# Patient Record
Sex: Female | Born: 1960 | Race: White | Hispanic: No | Smoking: Never smoker
Health system: Southern US, Community
[De-identification: ages and names within clinical notes are randomized; demographics above are authoritative.]

## PROBLEM LIST (undated history)

## (undated) HISTORY — PX: BREAST BIOPSY: SHX20

---

## 1997-09-01 ENCOUNTER — Inpatient Hospital Stay (HOSPITAL_COMMUNITY): Admission: AD | Admit: 1997-09-01 | Discharge: 1997-09-01 | Payer: Self-pay | Admitting: Obstetrics and Gynecology

## 1997-09-02 ENCOUNTER — Inpatient Hospital Stay (HOSPITAL_COMMUNITY): Admission: AD | Admit: 1997-09-02 | Discharge: 1997-09-05 | Payer: Self-pay | Admitting: Obstetrics and Gynecology

## 1999-01-20 ENCOUNTER — Encounter: Payer: Self-pay | Admitting: Emergency Medicine

## 1999-01-20 ENCOUNTER — Emergency Department (HOSPITAL_COMMUNITY): Admission: EM | Admit: 1999-01-20 | Discharge: 1999-01-20 | Payer: Self-pay | Admitting: Emergency Medicine

## 1999-02-13 ENCOUNTER — Other Ambulatory Visit: Admission: RE | Admit: 1999-02-13 | Discharge: 1999-02-13 | Payer: Self-pay | Admitting: Obstetrics and Gynecology

## 2000-05-13 ENCOUNTER — Other Ambulatory Visit: Admission: RE | Admit: 2000-05-13 | Discharge: 2000-05-13 | Payer: Self-pay | Admitting: Obstetrics and Gynecology

## 2001-11-25 ENCOUNTER — Other Ambulatory Visit: Admission: RE | Admit: 2001-11-25 | Discharge: 2001-11-25 | Payer: Self-pay | Admitting: Obstetrics and Gynecology

## 2004-03-20 ENCOUNTER — Other Ambulatory Visit: Admission: RE | Admit: 2004-03-20 | Discharge: 2004-03-20 | Payer: Self-pay | Admitting: Obstetrics and Gynecology

## 2005-03-19 ENCOUNTER — Encounter (INDEPENDENT_AMBULATORY_CARE_PROVIDER_SITE_OTHER): Payer: Self-pay | Admitting: Specialist

## 2005-03-19 ENCOUNTER — Encounter: Admission: RE | Admit: 2005-03-19 | Discharge: 2005-03-19 | Payer: Self-pay | Admitting: Surgery

## 2005-03-24 ENCOUNTER — Other Ambulatory Visit: Admission: RE | Admit: 2005-03-24 | Discharge: 2005-03-24 | Payer: Self-pay | Admitting: Obstetrics and Gynecology

## 2005-09-19 ENCOUNTER — Encounter: Admission: RE | Admit: 2005-09-19 | Discharge: 2005-09-19 | Payer: Self-pay | Admitting: Obstetrics and Gynecology

## 2006-01-22 ENCOUNTER — Encounter: Admission: RE | Admit: 2006-01-22 | Discharge: 2006-01-22 | Payer: Self-pay | Admitting: Obstetrics and Gynecology

## 2007-02-09 ENCOUNTER — Encounter: Admission: RE | Admit: 2007-02-09 | Discharge: 2007-02-09 | Payer: Self-pay | Admitting: Obstetrics and Gynecology

## 2008-08-10 ENCOUNTER — Encounter: Admission: RE | Admit: 2008-08-10 | Discharge: 2008-08-10 | Payer: Self-pay | Admitting: Obstetrics and Gynecology

## 2010-06-04 ENCOUNTER — Other Ambulatory Visit: Payer: Self-pay | Admitting: Obstetrics and Gynecology

## 2010-06-04 DIAGNOSIS — Z1231 Encounter for screening mammogram for malignant neoplasm of breast: Secondary | ICD-10-CM

## 2010-06-07 ENCOUNTER — Ambulatory Visit
Admission: RE | Admit: 2010-06-07 | Discharge: 2010-06-07 | Disposition: A | Discharge status: Home / Self Care | Payer: BC Managed Care – PPO | Source: Ambulatory Visit | Attending: Obstetrics and Gynecology | Admitting: Obstetrics and Gynecology

## 2010-06-07 DIAGNOSIS — Z1231 Encounter for screening mammogram for malignant neoplasm of breast: Secondary | ICD-10-CM

## 2011-05-01 ENCOUNTER — Other Ambulatory Visit: Payer: Self-pay | Admitting: Obstetrics and Gynecology

## 2011-05-01 DIAGNOSIS — Z1231 Encounter for screening mammogram for malignant neoplasm of breast: Secondary | ICD-10-CM

## 2011-06-09 ENCOUNTER — Ambulatory Visit: Payer: BC Managed Care – PPO

## 2012-02-19 ENCOUNTER — Other Ambulatory Visit: Payer: Self-pay | Admitting: Obstetrics and Gynecology

## 2012-02-19 DIAGNOSIS — Z1231 Encounter for screening mammogram for malignant neoplasm of breast: Secondary | ICD-10-CM

## 2012-03-26 ENCOUNTER — Ambulatory Visit
Admission: RE | Admit: 2012-03-26 | Discharge: 2012-03-26 | Disposition: A | Discharge status: Home / Self Care | Payer: BC Managed Care – PPO | Source: Ambulatory Visit | Attending: Obstetrics and Gynecology | Admitting: Obstetrics and Gynecology

## 2012-03-26 DIAGNOSIS — Z1231 Encounter for screening mammogram for malignant neoplasm of breast: Secondary | ICD-10-CM

## 2013-08-05 ENCOUNTER — Emergency Department (HOSPITAL_COMMUNITY): Payer: BC Managed Care – PPO

## 2013-08-05 ENCOUNTER — Encounter (HOSPITAL_COMMUNITY): Payer: Self-pay | Admitting: Emergency Medicine

## 2013-08-05 ENCOUNTER — Emergency Department (HOSPITAL_COMMUNITY)
Admission: EM | Admit: 2013-08-05 | Discharge: 2013-08-05 | Disposition: A | Discharge status: Home / Self Care | Payer: BC Managed Care – PPO | Attending: Emergency Medicine | Admitting: Emergency Medicine

## 2013-08-05 DIAGNOSIS — M5417 Radiculopathy, lumbosacral region: Secondary | ICD-10-CM

## 2013-08-05 DIAGNOSIS — IMO0002 Reserved for concepts with insufficient information to code with codable children: Secondary | ICD-10-CM | POA: Insufficient documentation

## 2013-08-05 DIAGNOSIS — R202 Paresthesia of skin: Secondary | ICD-10-CM

## 2013-08-05 DIAGNOSIS — S8012XA Contusion of left lower leg, initial encounter: Secondary | ICD-10-CM

## 2013-08-05 DIAGNOSIS — S7010XA Contusion of unspecified thigh, initial encounter: Secondary | ICD-10-CM | POA: Insufficient documentation

## 2013-08-05 MED ORDER — PREDNISONE 10 MG PO TABS: ORAL_TABLET | ORAL | Status: AC

## 2013-08-05 NOTE — ED Notes (Signed)
Pt returned from MRI °
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
## 2013-08-05 NOTE — Discharge Instructions (Signed)
Use heat on the sore area 3 times a day. Take Tylenol, for pain. Elevate your feet above yourr heart, as much as possible for several more days. Followup with the doctor , of your choice,  if not better in one or 2 weeks.    Contusion A contusion is a deep bruise. Contusions happen when an injury causes bleeding under the skin. Signs of bruising include pain, puffiness (swelling), and discolored skin. The contusion may turn blue, purple, or yellow. HOME CARE   Put ice on the injured area.  Put ice in a plastic bag.  Place a towel between your skin and the bag.  Leave the ice on for 15-20 minutes, 03-04 times a day.  Only take medicine as told by your doctor.  Rest the injured area.  If possible, raise (elevate) the injured area to lessen puffiness. GET HELP RIGHT AWAY IF:   You have more bruising or puffiness.  You have pain that is getting worse.  Your puffiness or pain is not helped by medicine. MAKE SURE YOU:   Understand these instructions.  Will watch your condition.  Will get help right away if you are not doing well or get worse. Document Released: 08/20/2007 Document Revised: 05/26/2011 Document Reviewed: 01/06/2011 [facility] Patient Information 2014 [place], [place].

## 2013-08-05 NOTE — ED Notes (Addendum)
Pt ambulating to restroom with steady gait
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
## 2013-08-05 NOTE — ED Notes (Signed)
Pt in MRI.

## 2013-08-18 ENCOUNTER — Other Ambulatory Visit: Payer: Self-pay

## 2013-08-18 DIAGNOSIS — Z1231 Encounter for screening mammogram for malignant neoplasm of breast: Secondary | ICD-10-CM

## 2013-08-22 ENCOUNTER — Ambulatory Visit
Admission: RE | Admit: 2013-08-22 | Discharge: 2013-08-22 | Disposition: A | Discharge status: Home / Self Care | Payer: BC Managed Care – PPO | Source: Ambulatory Visit

## 2013-08-22 DIAGNOSIS — Z1231 Encounter for screening mammogram for malignant neoplasm of breast: Secondary | ICD-10-CM

## 2014-03-07 ENCOUNTER — Other Ambulatory Visit: Payer: Self-pay | Admitting: Dermatology

## 2014-09-12 ENCOUNTER — Other Ambulatory Visit: Payer: Self-pay

## 2014-09-12 DIAGNOSIS — Z1231 Encounter for screening mammogram for malignant neoplasm of breast: Secondary | ICD-10-CM

## 2014-10-16 ENCOUNTER — Ambulatory Visit
Admission: RE | Admit: 2014-10-16 | Discharge: 2014-10-16 | Disposition: A | Discharge status: Home / Self Care | Payer: BLUE CROSS/BLUE SHIELD | Source: Ambulatory Visit

## 2014-10-16 DIAGNOSIS — Z1231 Encounter for screening mammogram for malignant neoplasm of breast: Secondary | ICD-10-CM

## 2015-10-16 ENCOUNTER — Other Ambulatory Visit: Payer: Self-pay | Admitting: Obstetrics and Gynecology

## 2015-10-16 DIAGNOSIS — Z1231 Encounter for screening mammogram for malignant neoplasm of breast: Secondary | ICD-10-CM

## 2015-10-18 ENCOUNTER — Ambulatory Visit
Admission: RE | Admit: 2015-10-18 | Discharge: 2015-10-18 | Disposition: A | Discharge status: Home / Self Care | Payer: BLUE CROSS/BLUE SHIELD | Source: Ambulatory Visit | Attending: Obstetrics and Gynecology | Admitting: Obstetrics and Gynecology

## 2015-10-18 DIAGNOSIS — Z1231 Encounter for screening mammogram for malignant neoplasm of breast: Secondary | ICD-10-CM

## 2016-08-14 ENCOUNTER — Other Ambulatory Visit: Payer: Self-pay | Admitting: Obstetrics and Gynecology

## 2016-08-14 DIAGNOSIS — Z1231 Encounter for screening mammogram for malignant neoplasm of breast: Secondary | ICD-10-CM

## 2016-10-21 ENCOUNTER — Ambulatory Visit
Admission: RE | Admit: 2016-10-21 | Discharge: 2016-10-21 | Disposition: A | Discharge status: Home / Self Care | Payer: BLUE CROSS/BLUE SHIELD | Source: Ambulatory Visit | Attending: Obstetrics and Gynecology | Admitting: Obstetrics and Gynecology

## 2016-10-21 DIAGNOSIS — Z1231 Encounter for screening mammogram for malignant neoplasm of breast: Secondary | ICD-10-CM

## 2017-08-03 ENCOUNTER — Other Ambulatory Visit: Payer: Self-pay

## 2017-08-03 ENCOUNTER — Emergency Department (HOSPITAL_COMMUNITY)
Admission: EM | Admit: 2017-08-03 | Discharge: 2017-08-03 | Disposition: A | Discharge status: Home / Self Care | Payer: BLUE CROSS/BLUE SHIELD | Attending: Emergency Medicine | Admitting: Emergency Medicine

## 2017-08-03 ENCOUNTER — Emergency Department (HOSPITAL_COMMUNITY): Payer: BLUE CROSS/BLUE SHIELD

## 2017-08-03 ENCOUNTER — Encounter (HOSPITAL_COMMUNITY): Payer: Self-pay

## 2017-08-03 DIAGNOSIS — Y998 Other external cause status: Secondary | ICD-10-CM | POA: Diagnosis not present

## 2017-08-03 DIAGNOSIS — S20211A Contusion of right front wall of thorax, initial encounter: Secondary | ICD-10-CM | POA: Insufficient documentation

## 2017-08-03 DIAGNOSIS — Y9389 Activity, other specified: Secondary | ICD-10-CM | POA: Insufficient documentation

## 2017-08-03 DIAGNOSIS — W01190A Fall on same level from slipping, tripping and stumbling with subsequent striking against furniture, initial encounter: Secondary | ICD-10-CM | POA: Insufficient documentation

## 2017-08-03 DIAGNOSIS — R0781 Pleurodynia: Secondary | ICD-10-CM

## 2017-08-03 DIAGNOSIS — Y929 Unspecified place or not applicable: Secondary | ICD-10-CM | POA: Diagnosis not present

## 2017-08-03 DIAGNOSIS — S299XXA Unspecified injury of thorax, initial encounter: Secondary | ICD-10-CM | POA: Diagnosis present

## 2017-08-03 MED ORDER — HYDROCODONE-ACETAMINOPHEN 5-325 MG PO TABS: 1.0000 | ORAL_TABLET | Freq: Four times a day (QID) | ORAL | 0 refills | Status: AC | PRN

## 2017-08-03 MED ORDER — HYDROCODONE-ACETAMINOPHEN 5-325 MG PO TABS
1.0000 | ORAL_TABLET | Freq: Once | ORAL | Status: AC
  Administered 2017-08-03: 1 via ORAL
  Filled 2017-08-03: qty 1

## 2017-08-03 NOTE — ED Provider Notes (Signed)
[facility]-EMERGENCY DEPT Provider Note   CSN: [id number] Arrival date & time: 08/03/17  0865     History   Chief Complaint Chief Complaint  Patient presents with  . Fall  . Rib Injury    HPI Pamela David is a 57 y.o. female.  HPI Patient tripped and fell yesterday striking her righ lower chest on a table.  Complaining of pain.  No fevers.  Pain is worse with movement and deep breaths.  States it was severe this morning and she went into "shock". History reviewed. No pertinent past medical history.  There are no active problems to display for this patient.   Past Surgical History:  Procedure Laterality Date  . BREAST BIOPSY       OB History   None      Home Medications    Prior to Admission medications   Medication Sig Start Date End Date Taking? Authorizing Provider  naproxen sodium (ALEVE) 220 MG tablet Take 440 mg by mouth as needed.   Yes [provider]  HYDROcodone-acetaminophen (NORCO/VICODIN) 5-325 MG tablet Take 1-2 tablets by mouth every 6 (six) hours as needed. 08/03/17   Benjiman Core, MD  predniSONE (DELTASONE) 10 MG tablet Take q day 6,5,4,3,2,1 08/05/13   Mancel Bale, MD    Family History History reviewed. No pertinent family history.  Social History Social History   Tobacco Use  . Smoking status: Never Smoker  . Smokeless tobacco: Never Used  Substance Use Topics  . Alcohol use: Yes  . Drug use: Never     Allergies   Sulfa antibiotics   Review of Systems Review of Systems  Constitutional: Negative for appetite change.  HENT: Negative for congestion.   Respiratory: Positive for shortness of breath.   Cardiovascular: Positive for chest pain.  Gastrointestinal: Negative for abdominal pain.  Genitourinary: Negative for flank pain.  Musculoskeletal: Negative for back pain.  Skin: Negative for rash.  Neurological: Negative for weakness.  Psychiatric/Behavioral: Negative for confusion.      Physical Exam Updated Vital Signs BP (!) 111/55 (BP Location: Right Arm)   Pulse 77   Temp 99.4 F (37.4 C) (Oral)   Resp 18   Ht  (1.549 m)   Wt 63.5 kg (140 lb)   SpO2 100%   BMI 26.45 kg/m   Physical Exam  Constitutional: She appears well-developed.  Patient appears uncomfortable  HENT:  Head: Normocephalic.  Eyes: Pupils are equal, round, and reactive to light.  Cardiovascular: Normal rate.  Pulmonary/Chest: She exhibits tenderness.  Equal breath sounds.  Tender right lower lateral and posterior chest wall without crepitance or deformity.  Abdominal: There is no tenderness.  No left or right upper quadrant tenderness.  Musculoskeletal:  No extremity tenderness.  Neurological: She is alert.  Skin: Skin is warm. Capillary refill takes less than 2 seconds.     ED Treatments / Results  Labs (all labs ordered are listed, but only abnormal results are displayed) Labs Reviewed - No data to display  EKG None  Radiology Dg Ribs Unilateral W/chest Right  Result Date: 08/03/2017 CLINICAL DATA:  Patient suffered a trip and fall last night chasing a cat night with a blow to the right side of the chest and onset of pain. Initial encounter. EXAM: RIGHT RIBS AND CHEST - 3+ VIEW COMPARISON:  None. FINDINGS: Punctate calcified granuloma is seen in the right upper lobe. The lungs are otherwise clear. No pneumothorax or pleural effusion. Heart size is normal. No fracture  or other focal bony abnormality IMPRESSION: Negative exam. Electronically Signed   By: Drusilla Kanner M.D.   On: 08/03/2017 11:21    Procedures Procedures (including critical care time)  Medications Ordered in ED Medications  HYDROcodone-acetaminophen (NORCO/VICODIN) 5-325 MG per tablet 1 tablet (1 tablet Oral Given 08/03/17 1302)     Initial Impression / Assessment and Plan / ED Course  I have reviewed the triage vital signs and the nursing notes.  Pertinent labs & imaging results that were  available during my care of the patient were reviewed by me and considered in my medical decision making (see chart for details).     Patient with fall.  Right-sided posterior and lateral chest pain.  No crepitance or subcu emphysema.  X-ray does not show fracture or pneumothorax.  Doubt intra-abdominal injury.  Pain medicine given.  Will treat as if there are clinical rib fractures.  Discharge home.  Final Clinical Impressions(s) / ED Diagnoses   Final diagnoses:  Contusion of rib on right side, initial encounter    ED Discharge Orders        Ordered    HYDROcodone-acetaminophen (NORCO/VICODIN) 5-325 MG tablet  Every 6 hours PRN     08/03/17 1308       Benjiman Core, MD 08/03/17 1311

## 2017-08-03 NOTE — ED Triage Notes (Signed)
Patient states she was chasing a cat and fell over a table last night. Patient c/o right rib pain. Patient denies any breathing problems.

## 2017-11-05 ENCOUNTER — Other Ambulatory Visit: Payer: Self-pay | Admitting: Obstetrics and Gynecology

## 2017-11-05 DIAGNOSIS — Z1231 Encounter for screening mammogram for malignant neoplasm of breast: Secondary | ICD-10-CM

## 2017-12-04 ENCOUNTER — Ambulatory Visit: Payer: BLUE CROSS/BLUE SHIELD

## 2018-01-20 ENCOUNTER — Ambulatory Visit
Admission: RE | Admit: 2018-01-20 | Discharge: 2018-01-20 | Disposition: A | Discharge status: Home / Self Care | Payer: BLUE CROSS/BLUE SHIELD | Source: Ambulatory Visit | Attending: Obstetrics and Gynecology | Admitting: Obstetrics and Gynecology

## 2018-01-20 DIAGNOSIS — Z1231 Encounter for screening mammogram for malignant neoplasm of breast: Secondary | ICD-10-CM

## 2019-02-17 ENCOUNTER — Other Ambulatory Visit: Payer: Self-pay

## 2019-02-17 ENCOUNTER — Other Ambulatory Visit: Payer: Self-pay | Admitting: Obstetrics and Gynecology

## 2019-02-17 ENCOUNTER — Ambulatory Visit
Admission: RE | Admit: 2019-02-17 | Discharge: 2019-02-17 | Disposition: A | Discharge status: Home / Self Care | Payer: BLUE CROSS/BLUE SHIELD | Source: Ambulatory Visit | Attending: Obstetrics and Gynecology | Admitting: Obstetrics and Gynecology

## 2019-02-17 DIAGNOSIS — Z1231 Encounter for screening mammogram for malignant neoplasm of breast: Secondary | ICD-10-CM

## 2019-08-04 ENCOUNTER — Telehealth: Payer: Self-pay | Admitting: Podiatry

## 2019-08-04 ENCOUNTER — Encounter: Payer: Self-pay | Admitting: Podiatry

## 2019-08-04 ENCOUNTER — Other Ambulatory Visit: Payer: Self-pay

## 2019-08-04 ENCOUNTER — Ambulatory Visit (INDEPENDENT_AMBULATORY_CARE_PROVIDER_SITE_OTHER): Payer: BC Managed Care – PPO | Admitting: Podiatry

## 2019-08-04 ENCOUNTER — Ambulatory Visit (INDEPENDENT_AMBULATORY_CARE_PROVIDER_SITE_OTHER): Payer: BC Managed Care – PPO

## 2019-08-04 ENCOUNTER — Other Ambulatory Visit: Payer: Self-pay | Admitting: Podiatry

## 2019-08-04 DIAGNOSIS — M79672 Pain in left foot: Secondary | ICD-10-CM

## 2019-08-04 DIAGNOSIS — M779 Enthesopathy, unspecified: Secondary | ICD-10-CM

## 2019-08-04 NOTE — Telephone Encounter (Signed)
Pt. Would like to know if she can resume, normal activities. She was seen today.

## 2019-08-08 ENCOUNTER — Telehealth: Payer: Self-pay | Admitting: *Deleted

## 2019-08-08 NOTE — Telephone Encounter (Signed)
Returned patient's phone call from 08/04/19 and left a voicemail message.  Patient wanted to know if they could resume normal activities. I talked to Dr. Charlsie Merles and he said "Yes, patient can resume normal activities".

## 2019-08-08 NOTE — Progress Notes (Signed)
Subjective:   Patient ID: Pamela David, female   DOB: 59 y.o.   MRN: [id number]   HPI Patient presents stating she is developed pain on her left foot and while it was worse before its not as bothersome now but it is bothersome and it feels like it swollen.  Patient does not remember specific injury associated with this and states that she has been trying shoe gear modifications.  Patient does not smoke likes to be active   Review of Systems  All other systems reviewed and are negative.       Objective:  Physical Exam Vitals and nursing note reviewed.  Constitutional:      Appearance: She is well-developed.  Pulmonary:     Effort: Pulmonary effort is normal.  Musculoskeletal:        General: Normal range of motion.  Skin:    General: Skin is warm.  Neurological:     Mental Status: She is alert.     Neurovascular status intact muscle strength was found to be adequate range of motion within normal limits.  Patient is found on the left second and third MPJs inflammation fluid buildup around the joint.  I did not note digital malalignment for pathology from that standpoint and it is painful when pressed in this area and localized to this area.  Patient has good digital perfusion     Assessment:  Probability for inflammatory capsulitis of the second and third MPJs left with second predominant 8     Plan:  NP reviewed with patient x-rays reviewed with patient and today I discussed inflammatory capsulitis versus neuroma and will get a try to just use offloading of the area with padding along with rigid bottom shoes oral anti-inflammatories with prescription for diclofenac and educated on possibility for injection or osteotomy if symptoms persist.  Reappoint as indicated  X-rays indicate that there is no signs of stress fracture or arthritis associated with this condition

## 2020-03-29 ENCOUNTER — Other Ambulatory Visit: Payer: Self-pay | Admitting: Obstetrics and Gynecology

## 2020-03-29 DIAGNOSIS — Z1231 Encounter for screening mammogram for malignant neoplasm of breast: Secondary | ICD-10-CM

## 2020-05-10 ENCOUNTER — Ambulatory Visit
Admission: RE | Admit: 2020-05-10 | Discharge: 2020-05-10 | Disposition: A | Discharge status: Home / Self Care | Payer: BC Managed Care – PPO | Source: Ambulatory Visit | Attending: Obstetrics and Gynecology | Admitting: Obstetrics and Gynecology

## 2020-05-10 ENCOUNTER — Other Ambulatory Visit: Payer: Self-pay

## 2020-05-10 DIAGNOSIS — Z1231 Encounter for screening mammogram for malignant neoplasm of breast: Secondary | ICD-10-CM

## 2021-03-13 ENCOUNTER — Ambulatory Visit: Payer: BC Managed Care – PPO | Admitting: Podiatry

## 2021-03-13 ENCOUNTER — Other Ambulatory Visit: Payer: Self-pay

## 2021-03-13 ENCOUNTER — Ambulatory Visit (INDEPENDENT_AMBULATORY_CARE_PROVIDER_SITE_OTHER): Payer: BC Managed Care – PPO

## 2021-03-13 DIAGNOSIS — S93401A Sprain of unspecified ligament of right ankle, initial encounter: Secondary | ICD-10-CM
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
## 2021-04-09 ENCOUNTER — Other Ambulatory Visit: Payer: Self-pay | Admitting: Obstetrics and Gynecology

## 2021-04-09 DIAGNOSIS — Z1231 Encounter for screening mammogram for malignant neoplasm of breast: Secondary | ICD-10-CM

## 2021-04-10 ENCOUNTER — Other Ambulatory Visit: Payer: Self-pay

## 2021-04-10 ENCOUNTER — Ambulatory Visit: Payer: BC Managed Care – PPO | Admitting: Podiatry

## 2021-04-10 ENCOUNTER — Encounter: Payer: Self-pay | Admitting: Podiatry

## 2021-04-10 DIAGNOSIS — S93401A Sprain of unspecified ligament of right ankle, initial encounter: Secondary | ICD-10-CM | POA: Diagnosis not present

## 2021-04-10 NOTE — Progress Notes (Signed)
°  Subjective:  Patient ID: Pamela David, female    DOB: 1961/03/13,  MRN: [id number]  Chief Complaint  Patient presents with   Foot Pain    Right foot     61 y.o. female presents with the above complaint.  Patient presents with a follow-up of right ankle sprain.  She states that she is doing a lot better the cam boot immobilization helped considerably.  She still has some residual pain.  She wanted know if she can start transition to regular shoes.  She denies any other acute complaints.   Review of Systems: Negative except as noted in the HPI. Denies N/V/F/Ch.  History reviewed. No pertinent past medical history.  Current Outpatient Medications:    cephALEXin (KEFLEX) 500 MG capsule, cephalexin 500 mg capsule  TK ONE C PO TID FOR 5 DAYS, Disp: , Rfl:    HYDROcodone-acetaminophen (NORCO/VICODIN) 5-325 MG tablet, Take 1-2 tablets by mouth every 6 (six) hours as needed., Disp: 10 tablet, Rfl: 0   influenza vac recom quadrivalent (FLUBLOK QUADRIVALENT) 0.5 ML injection, Flublok Quad 2020-2021 (PF) 180 mcg (45 mcg x 4)/0.5 mL IM syringe, Disp: , Rfl:    naproxen sodium (ALEVE) 220 MG tablet, Take 440 mg by mouth as needed., Disp: , Rfl:    predniSONE (DELTASONE) 10 MG tablet, Take q day 6,5,4,3,2,1, Disp: 21 tablet, Rfl: 0  Social History   Tobacco Use  Smoking Status Never  Smokeless Tobacco Never    Allergies  Allergen Reactions   Sulfa Antibiotics    Objective:  There were no vitals filed for this visit. There is no height or weight on file to calculate BMI. Constitutional Well developed. Well nourished.  Vascular Dorsalis pedis pulses palpable bilaterally. Posterior tibial pulses palpable bilaterally. Capillary refill normal to all digits.  No cyanosis or clubbing noted. Pedal hair growth normal.  Neurologic Normal speech. Oriented to person, place, and time. Epicritic sensation to light touch grossly present bilaterally.  Dermatologic Nails well groomed and normal in  appearance. No open wounds. No skin lesions.  Orthopedic: Pain on palpation to the right ankle ATFL ligament.  No pain at the peroneal tendon or at the Achilles tendon or the posterior tibial tendon.  Mild deep intra-articular ankle pain noted with range of motion active and passive.  Negative anterior drawer test or talar tilt test.   Radiographs: 3 views of skeletally mature the right foot: No ankle fractures noted.  No gross bony abnormalities noted.  There may be a possible of osteochondral lesion noted to the medial lateral shoulder however it could also be just an artifact of x-ray imaging.  If the pain does not resolve we can clinically monitor. Assessment:   1. Moderate ankle sprain, right, initial encounter     Plan:  Patient was evaluated and treated and all questions answered.  Right moderate ankle sprain with a history of ankle sprain with a possible underlying OCD lesion -All questions and concerns were discussed with the patient in extensive detail. -Clinically improving with cam boot immobilization.  At this time patient will benefit from Tri-Lock brace transition given that she has had significant improvement in pain.  I discussed with her to do it slowly with over the next week.  She states understanding will begin the transition -If there is no improvement we will discuss MRI of bursa steroid injection if needed.  She states understanding. -Follow-up ankle brace was dispensed  No follow-ups on file.

## 2021-05-14 ENCOUNTER — Ambulatory Visit
Admission: RE | Admit: 2021-05-14 | Discharge: 2021-05-14 | Disposition: A | Discharge status: Home / Self Care | Payer: BC Managed Care – PPO | Source: Ambulatory Visit | Attending: Obstetrics and Gynecology | Admitting: Obstetrics and Gynecology

## 2021-05-14 DIAGNOSIS — Z1231 Encounter for screening mammogram for malignant neoplasm of breast: Secondary | ICD-10-CM

## 2021-05-16 ENCOUNTER — Other Ambulatory Visit: Payer: Self-pay | Admitting: Obstetrics and Gynecology

## 2021-05-16 DIAGNOSIS — R928 Other abnormal and inconclusive findings on diagnostic imaging of breast: Secondary | ICD-10-CM

## 2021-05-22 ENCOUNTER — Ambulatory Visit: Payer: BC Managed Care – PPO | Admitting: Podiatry

## 2021-06-04 ENCOUNTER — Ambulatory Visit
Admission: RE | Admit: 2021-06-04 | Discharge: 2021-06-04 | Disposition: A | Discharge status: Home / Self Care | Payer: BC Managed Care – PPO | Source: Ambulatory Visit | Attending: Obstetrics and Gynecology | Admitting: Obstetrics and Gynecology

## 2021-06-04 DIAGNOSIS — R928 Other abnormal and inconclusive findings on diagnostic imaging of breast: Secondary | ICD-10-CM

## 2022-06-17 ENCOUNTER — Other Ambulatory Visit: Payer: Self-pay | Admitting: Obstetrics and Gynecology

## 2022-06-17 DIAGNOSIS — Z1231 Encounter for screening mammogram for malignant neoplasm of breast: Secondary | ICD-10-CM

## 2022-06-20 ENCOUNTER — Ambulatory Visit
Admission: RE | Admit: 2022-06-20 | Discharge: 2022-06-20 | Disposition: A | Discharge status: Home / Self Care | Payer: BC Managed Care – PPO | Source: Ambulatory Visit | Attending: Obstetrics and Gynecology | Admitting: Obstetrics and Gynecology

## 2022-06-20 DIAGNOSIS — Z1231 Encounter for screening mammogram for malignant neoplasm of breast: Secondary | ICD-10-CM

## 2023-05-25 ENCOUNTER — Other Ambulatory Visit: Payer: Self-pay | Admitting: Obstetrics and Gynecology

## 2023-05-25 DIAGNOSIS — Z1231 Encounter for screening mammogram for malignant neoplasm of breast: Secondary | ICD-10-CM

## 2023-06-24 ENCOUNTER — Ambulatory Visit
Admission: RE | Admit: 2023-06-24 | Discharge: 2023-06-24 | Disposition: A | Discharge status: Home / Self Care | Source: Ambulatory Visit | Attending: Obstetrics and Gynecology | Admitting: Obstetrics and Gynecology

## 2023-06-24 DIAGNOSIS — Z1231 Encounter for screening mammogram for malignant neoplasm of breast: Secondary | ICD-10-CM

## 2023-07-09 IMAGING — US US BREAST*L* LIMITED INC AXILLA
1 series · 9 of 9 positions shown · non-contrast
Comparison: Previous exam(s).

CLINICAL DATA: 60-year-old female for further evaluation of
possible LEFT breast mass on screening mammogram.

EXAM:
DIGITAL DIAGNOSTIC UNILATERAL LEFT MAMMOGRAM WITH TOMOSYNTHESIS AND
CAD; ULTRASOUND LEFT BREAST LIMITED
TECHNIQUE: Left digital diagnostic mammography and breast tomosynthesis was
performed. The images were evaluated with computer-aided detection.;
Targeted ultrasound examination of the left breast was performed.

[Series 1: us breast*left* limited inc axilla · 0.06mm/px · 9 of 9 slices shown]
[im 1/9]
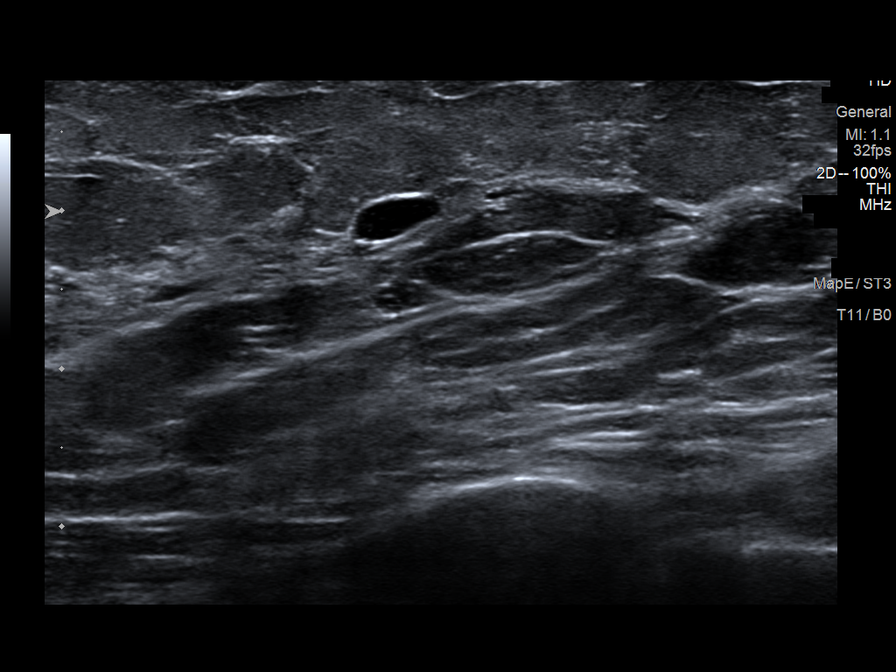
[im 2/9]
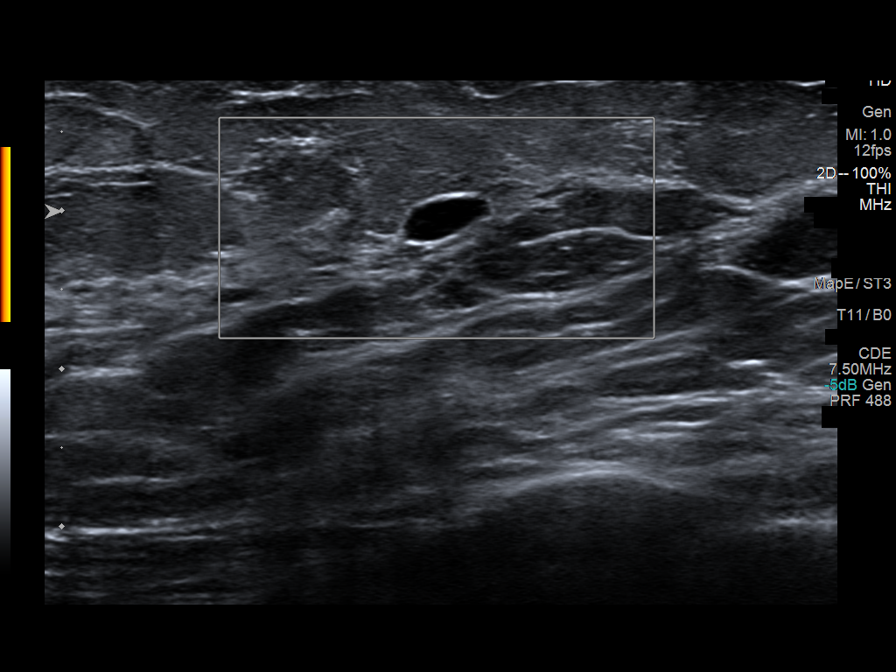
[im 3/9]
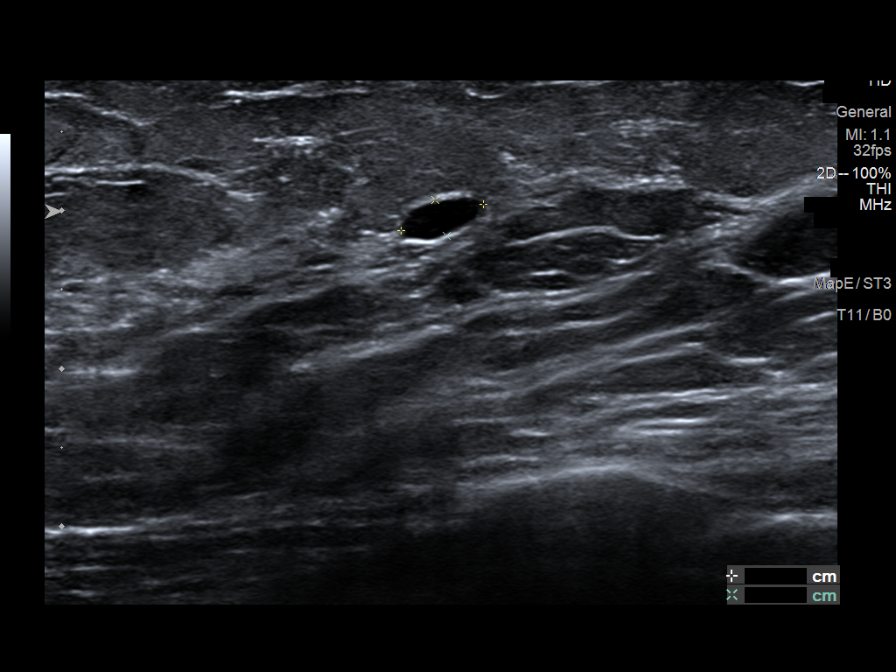
[im 4/9]
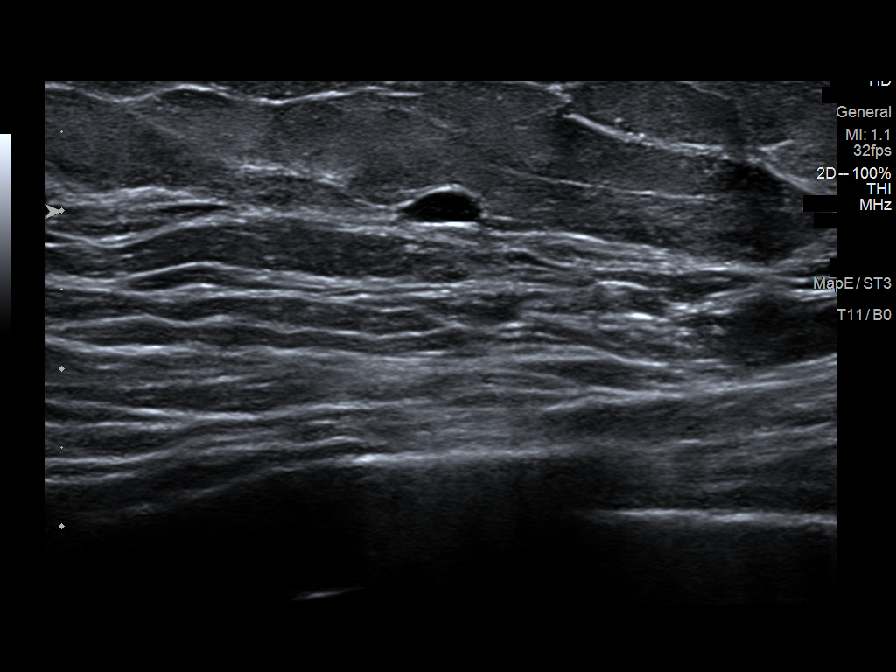
[im 5/9]
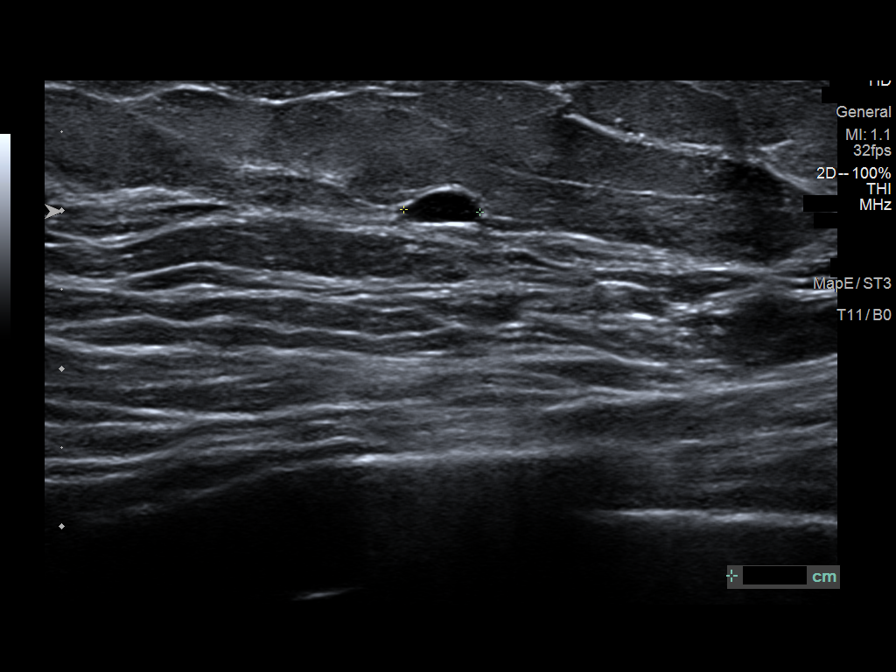
[im 6/9]
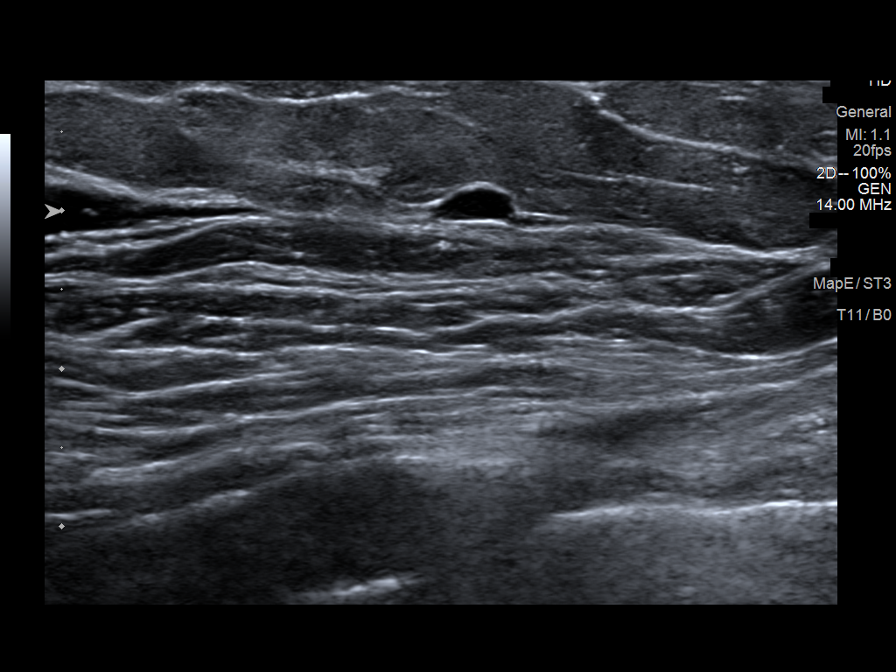
[im 7/9]
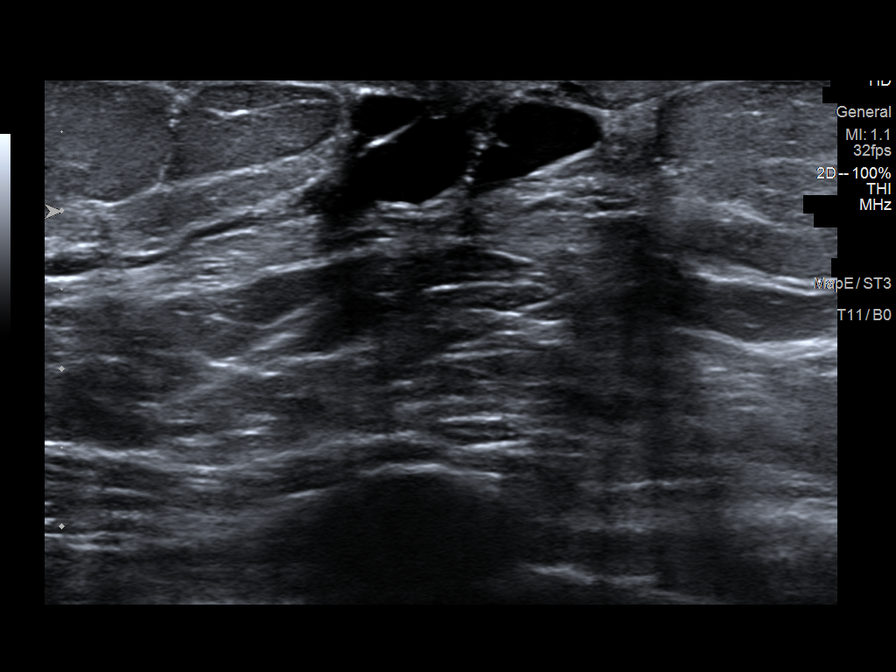
[im 8/9]
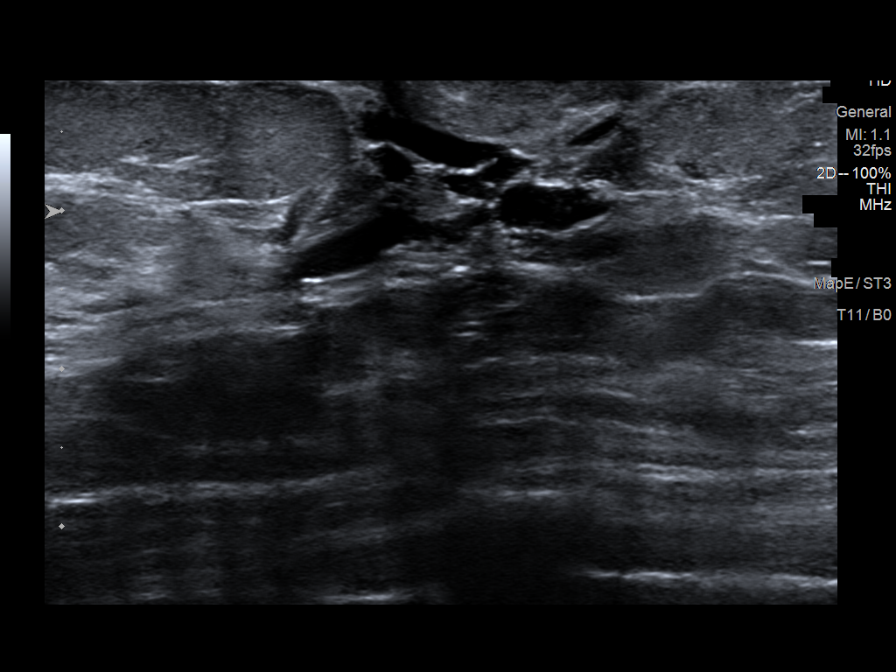
[im 9/9]
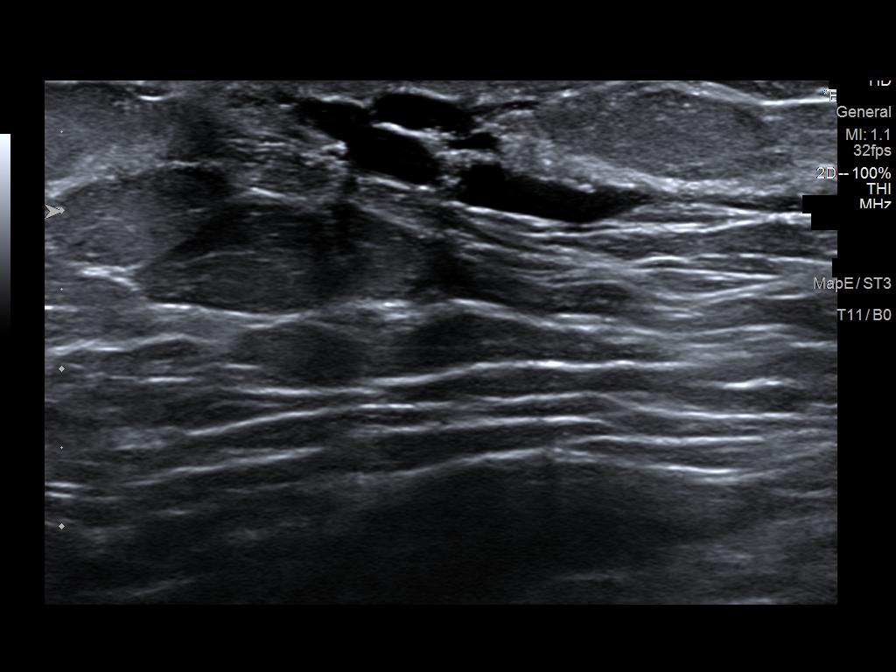

[9 of 9 positions shown; findings below may reference images not displayed]

ACR Breast Density Category b: There are scattered areas of
fibroglandular density.
FINDINGS: 2D/3D spot compression views of the LEFT breast demonstrate a
persistent circumscribed oval low-density mass within the slightly
OUTER central LEFT breast.

Targeted ultrasound is performed, showing a 0.6 x 0.2 x 0.5 cm
benign cyst at the 3 o'clock position of the LEFT breast 2 cm from
the nipple, a good correlate to the screening study finding. No
other sonographic abnormalities are noted within the central LEFT
breast.

Incidental note is made of duct ectasia in the RETROAREOLAR LEFT
breast.
IMPRESSION: Benign cyst within the LEFT breast corresponding to the screening
study finding.

RECOMMENDATION:
Bilateral screening mammogram in 1 year.

I have discussed the findings and recommendations with the patient.
If applicable, a reminder letter will be sent to the patient
regarding the next appointment.

BI-RADS CATEGORY  2: Benign.

## 2023-07-09 IMAGING — MG MM DIGITAL DIAGNOSTIC UNILAT*L* W/ TOMO W/ CAD
4 series · 4 of 12 positions shown · non-contrast
Comparison: Previous exam(s).

CLINICAL DATA: 60-year-old female for further evaluation of
possible LEFT breast mass on screening mammogram.

EXAM:
DIGITAL DIAGNOSTIC UNILATERAL LEFT MAMMOGRAM WITH TOMOSYNTHESIS AND
CAD; ULTRASOUND LEFT BREAST LIMITED
TECHNIQUE: Left digital diagnostic mammography and breast tomosynthesis was
performed. The images were evaluated with computer-aided detection.;
Targeted ultrasound examination of the left breast was performed.

[L CC synth-2D]
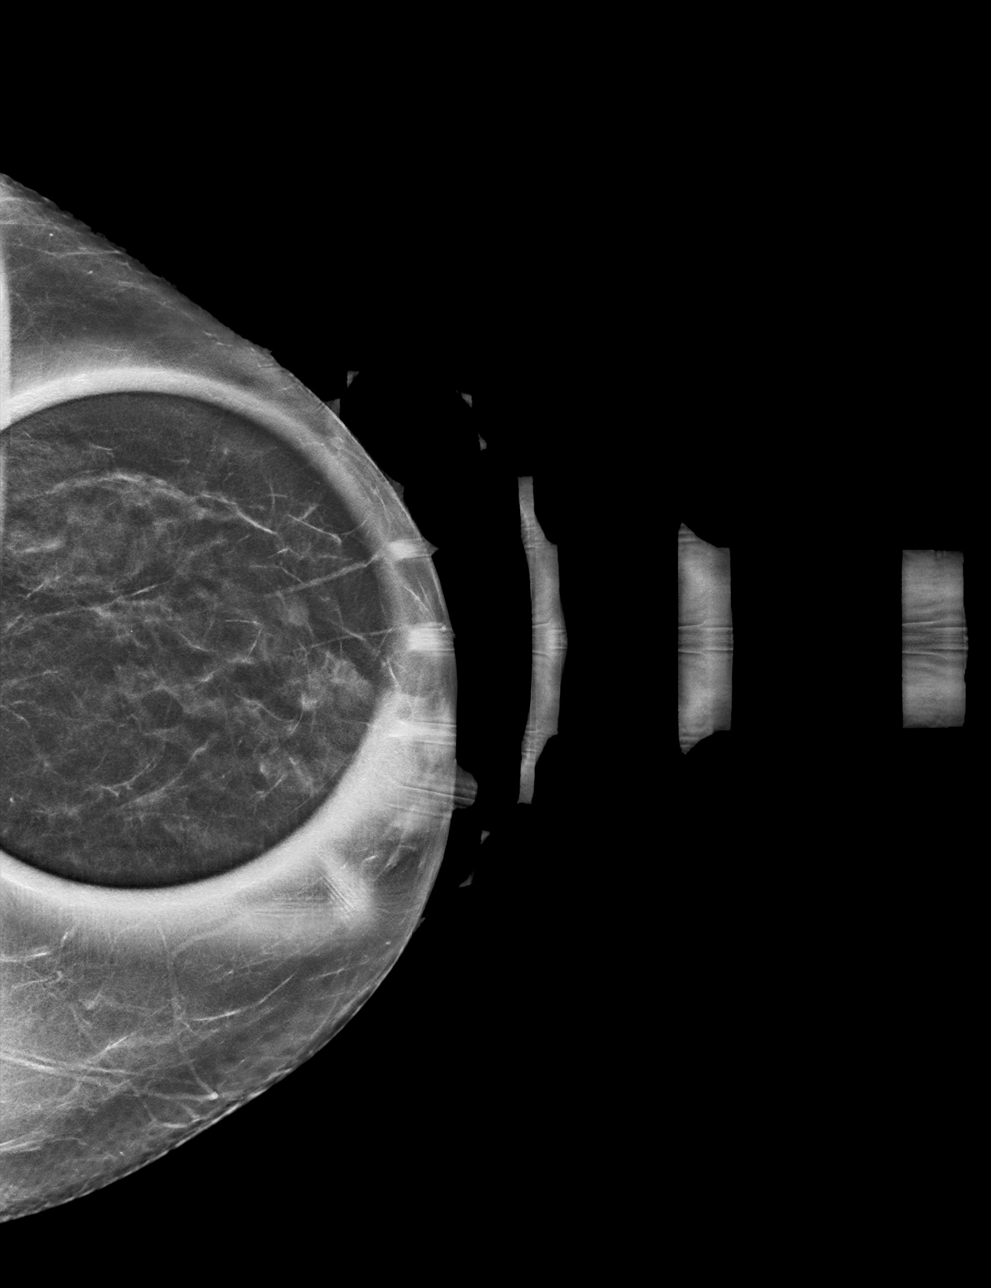

[L MLO synth-2D]
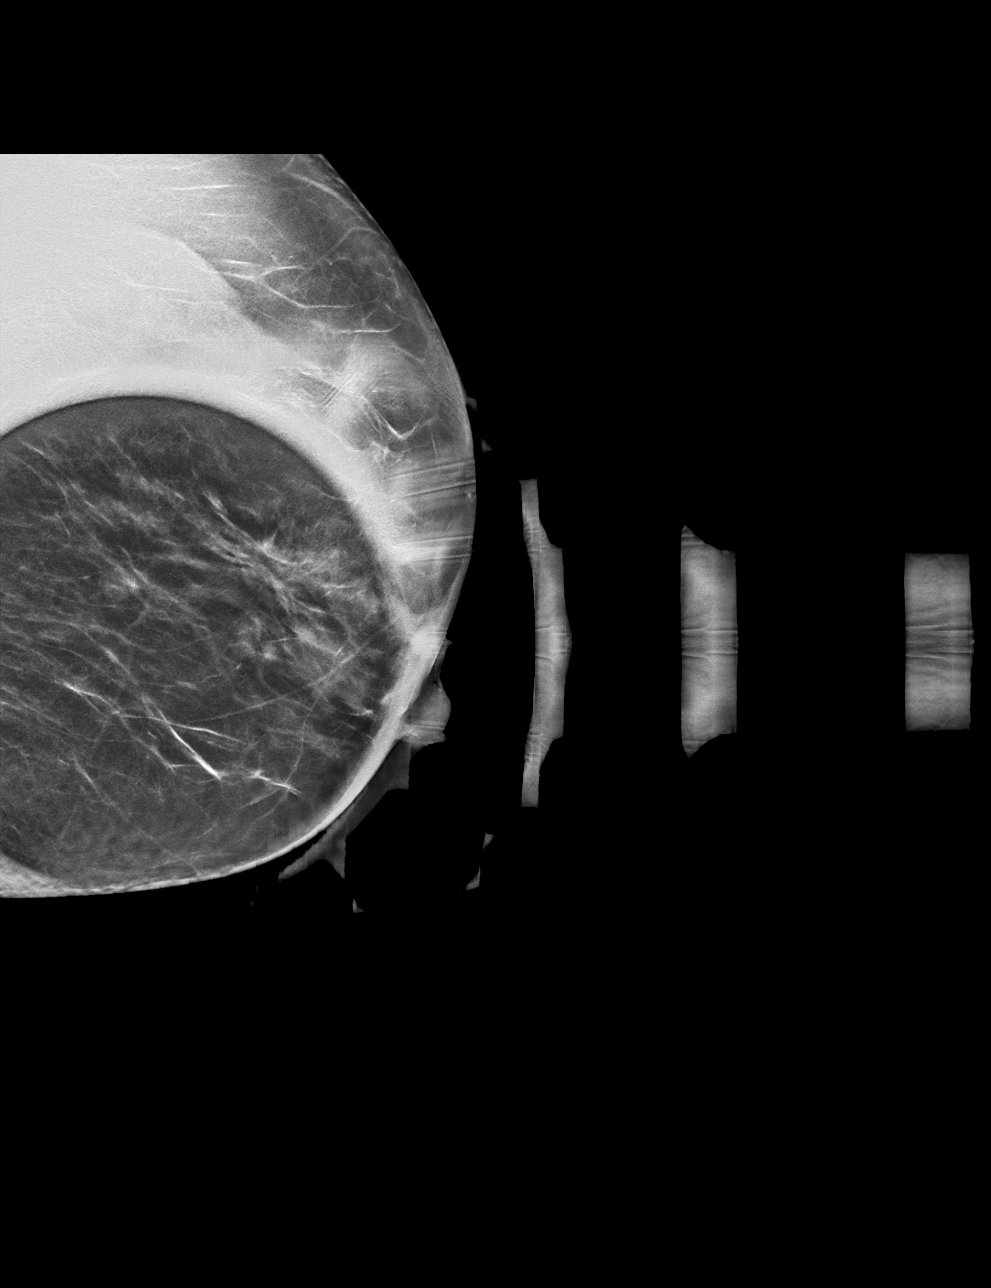

[L CC tomo · tomo slice 25/50.0]
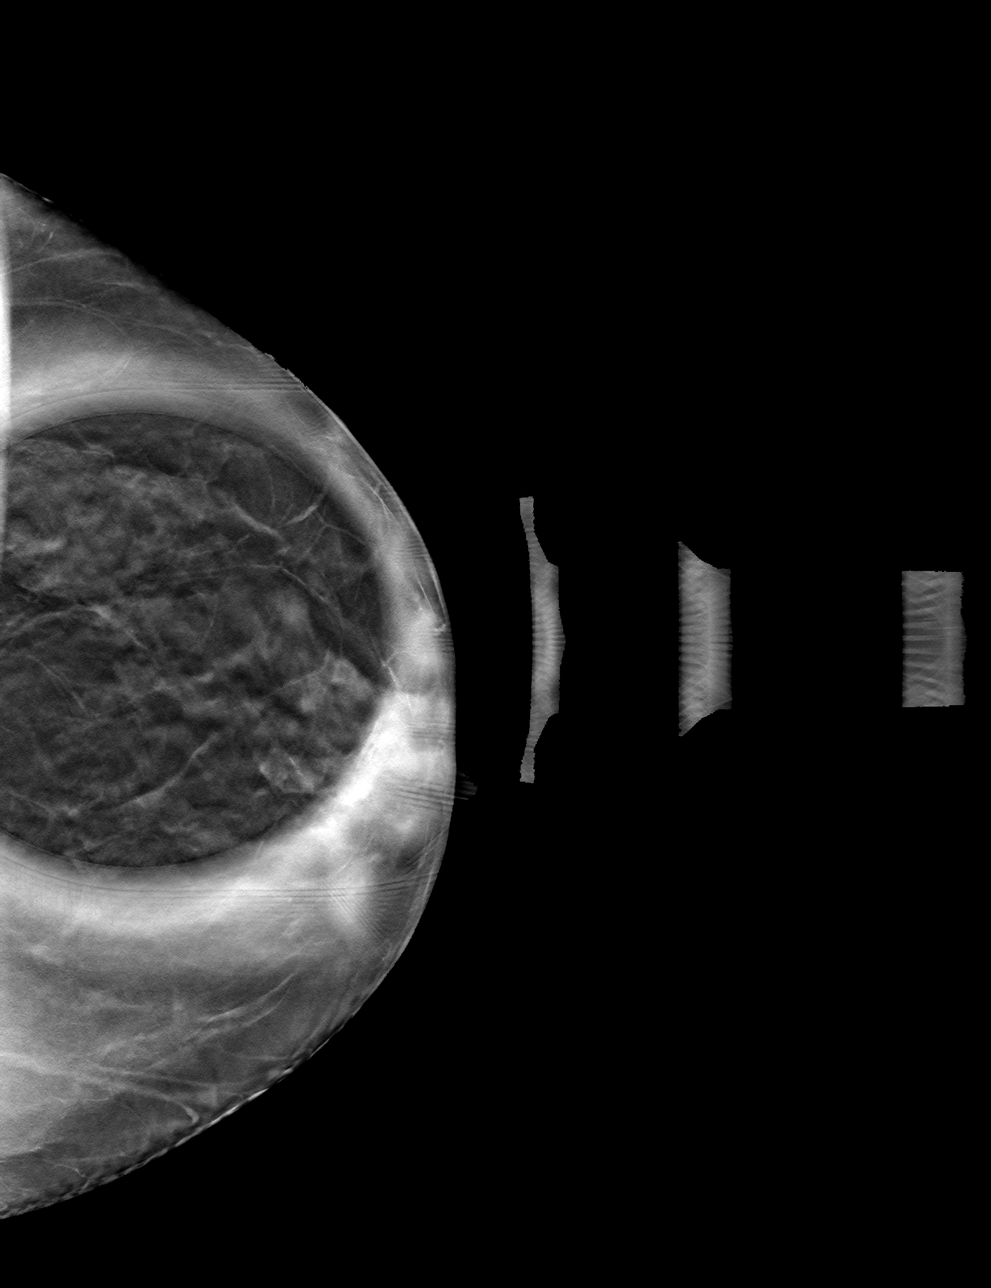

[L MLO tomo · tomo slice 26/51.0]
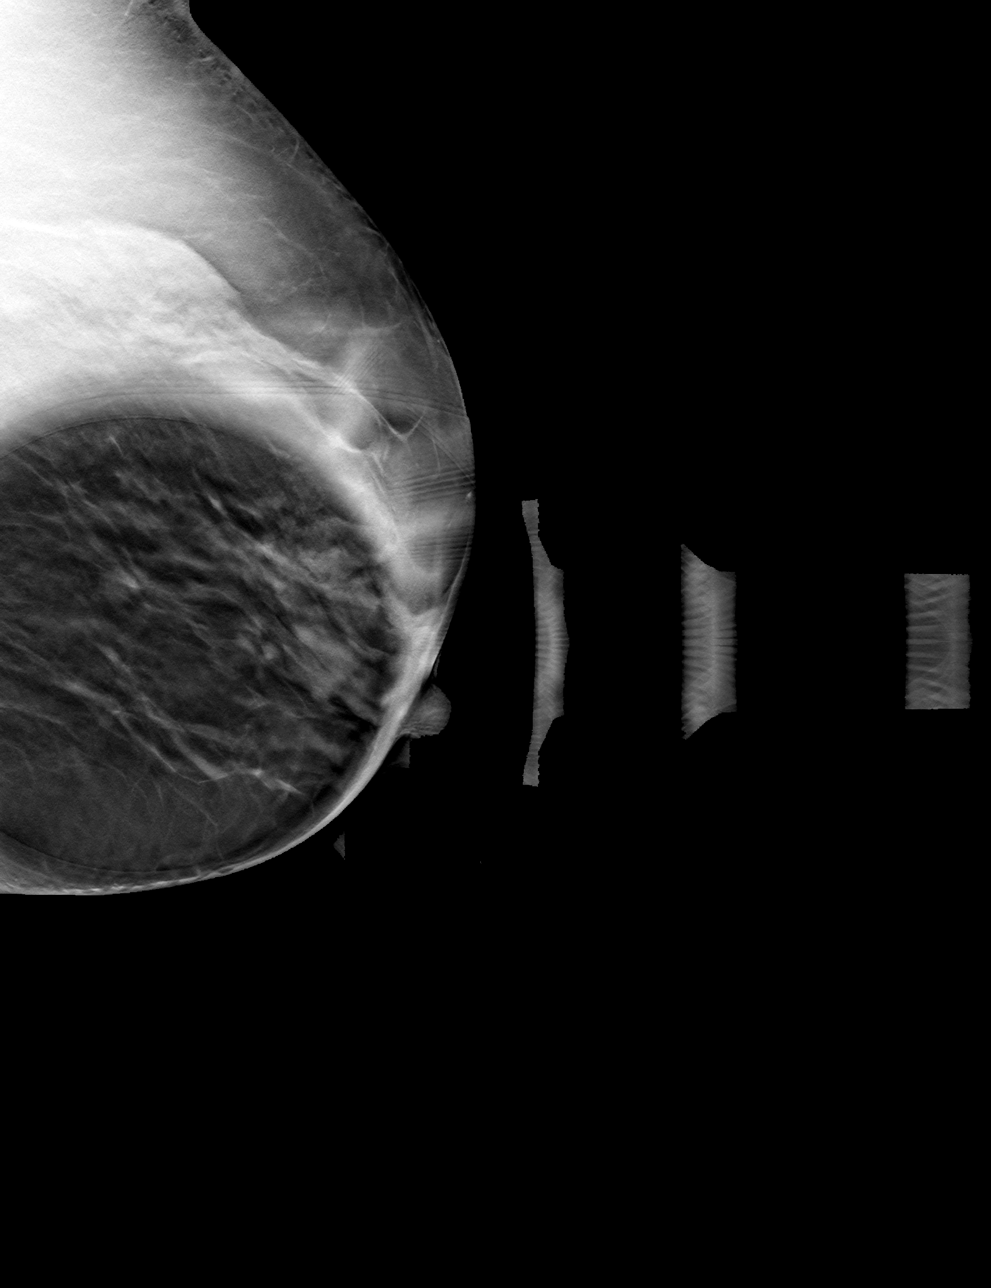

[4 of 12 positions shown; findings below may reference images not displayed]

ACR Breast Density Category b: There are scattered areas of
fibroglandular density.
FINDINGS: 2D/3D spot compression views of the LEFT breast demonstrate a
persistent circumscribed oval low-density mass within the slightly
OUTER central LEFT breast.

Targeted ultrasound is performed, showing a 0.6 x 0.2 x 0.5 cm
benign cyst at the 3 o'clock position of the LEFT breast 2 cm from
the nipple, a good correlate to the screening study finding. No
other sonographic abnormalities are noted within the central LEFT
breast.

Incidental note is made of duct ectasia in the RETROAREOLAR LEFT
breast.
IMPRESSION: Benign cyst within the LEFT breast corresponding to the screening
study finding.

RECOMMENDATION:
Bilateral screening mammogram in 1 year.

I have discussed the findings and recommendations with the patient.
If applicable, a reminder letter will be sent to the patient
regarding the next appointment.

BI-RADS CATEGORY  2: Benign.
